# Patient Record
Sex: Female | Born: 1990 | Race: White | Hispanic: No | Marital: Single | Smoking: Never smoker
Health system: Southern US, Community
[De-identification: ages and names within clinical notes are randomized; demographics above are authoritative.]

## PROBLEM LIST (undated history)

## (undated) DIAGNOSIS — E041 Nontoxic single thyroid nodule: Secondary | ICD-10-CM

## (undated) DIAGNOSIS — F988 Other specified behavioral and emotional disorders with onset usually occurring in childhood and adolescence: Secondary | ICD-10-CM

## (undated) DIAGNOSIS — T7840XA Allergy, unspecified, initial encounter: Secondary | ICD-10-CM

## (undated) DIAGNOSIS — K219 Gastro-esophageal reflux disease without esophagitis: Secondary | ICD-10-CM

## (undated) HISTORY — PX: TONSILLECTOMY: SUR1361

## (undated) HISTORY — PX: EYE SURGERY: SHX253

---
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
## 2013-02-28 IMAGING — CR DG CHEST 2V
1 series · 3 of 3 positions shown · non-contrast
Comparison: none

REASON FOR EXAM: c/o cough and chest congestion for a month
COMMENTS:

PROCEDURE:     MDR - MDR CHEST PA(OR AP) AND LATERAL  - August 08, 2011 [DATE]
RESULT:     The lungs are clear. The cardiac silhouette and visualized bony
skeleton are unremarkable.

[Series 1: view not recorded · 0.17mm/px · 3 of 3 slices shown]
[im 1/3]
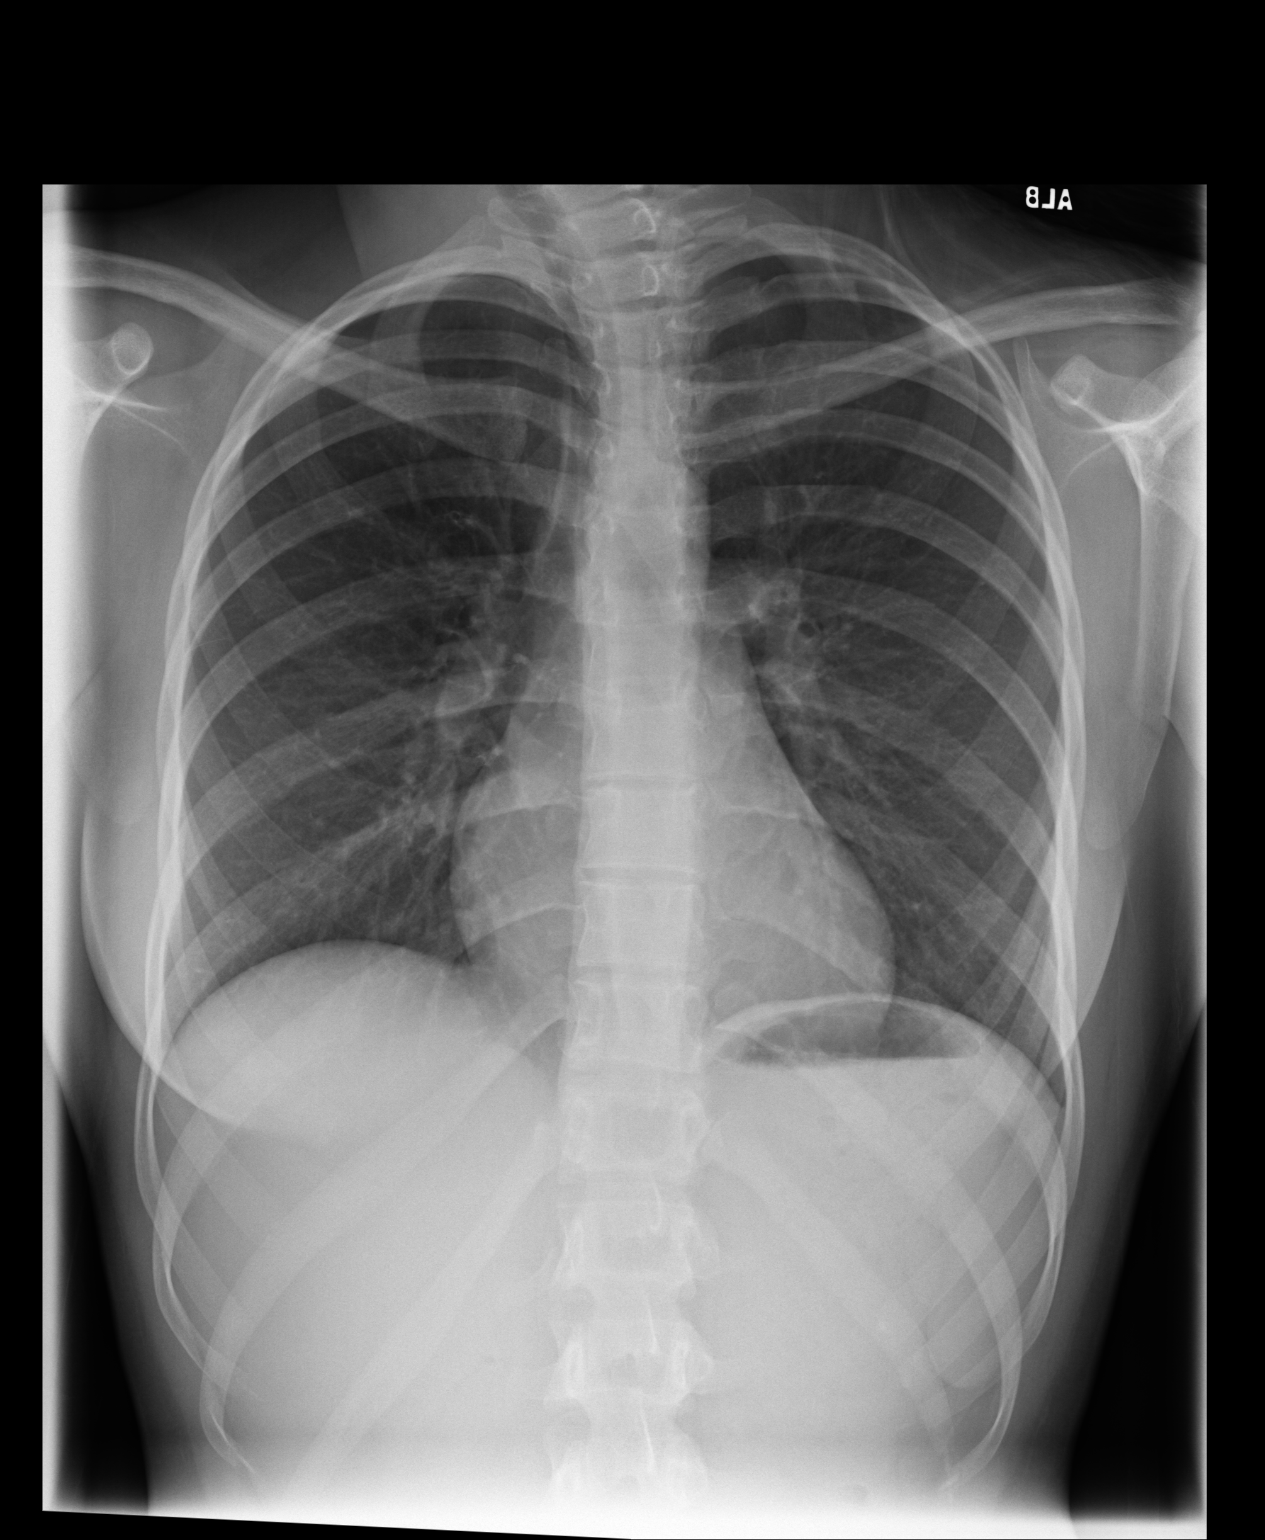
[im 2/3]
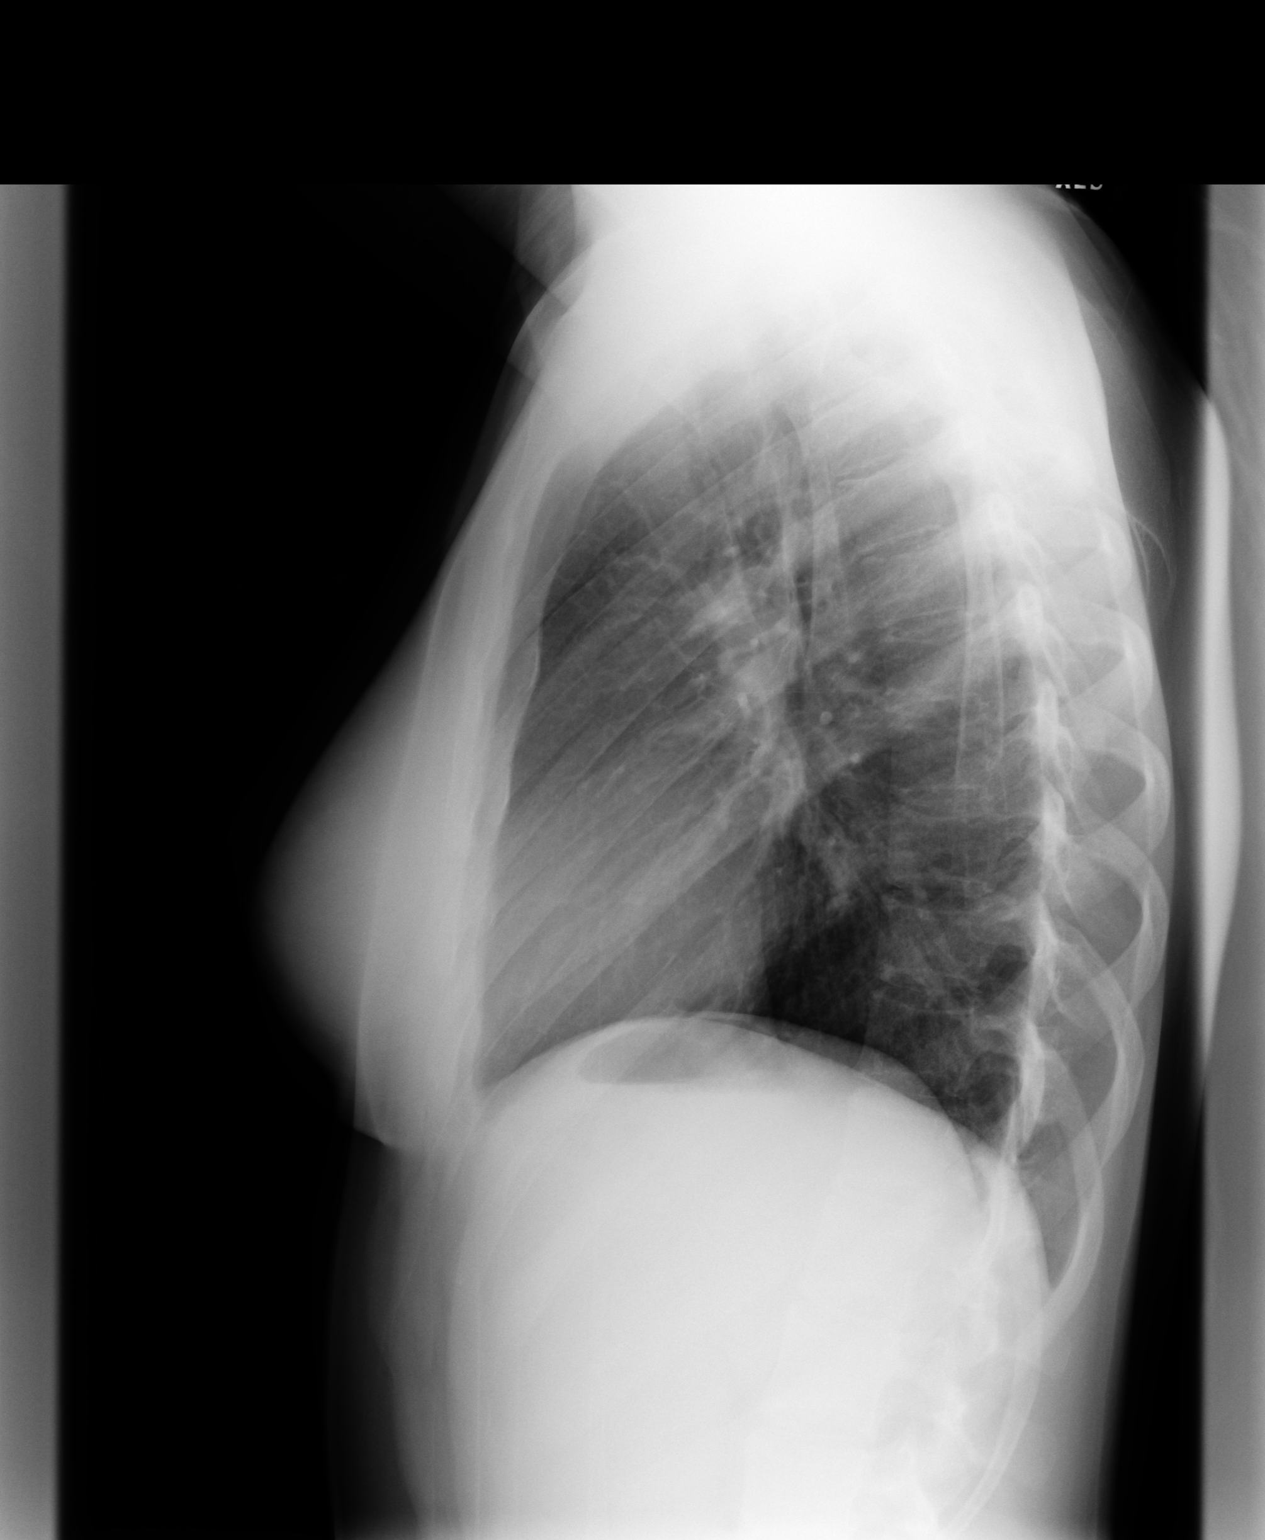
[im 3/3]
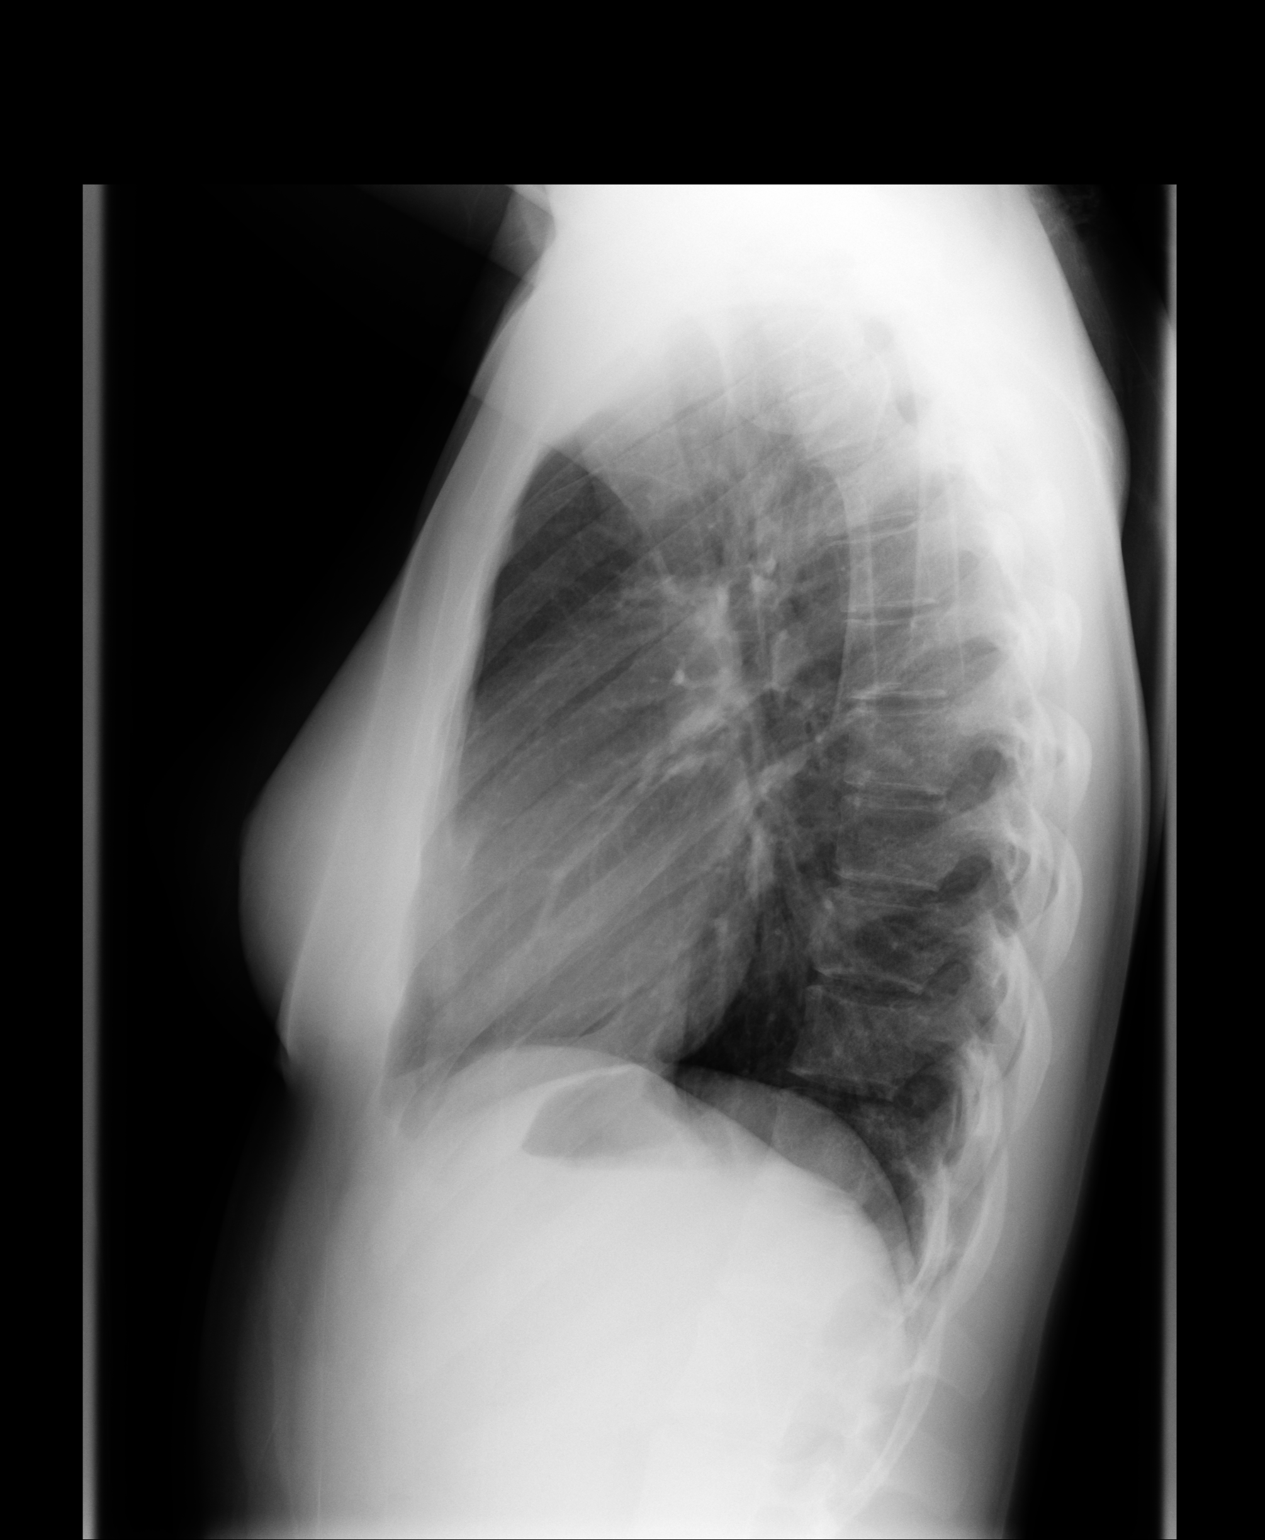

[3 of 3 positions shown; findings below may reference images not displayed]

IMPRESSION: 1. Chest radiograph without evidence of acute cardiopulmonary disease.

## 2017-06-14 ENCOUNTER — Other Ambulatory Visit
Admission: RE | Admit: 2017-06-14 | Discharge: 2017-06-14 | Disposition: A | Discharge status: Home / Self Care | Payer: 59 | Source: Ambulatory Visit | Attending: Gastroenterology | Admitting: Gastroenterology

## 2017-06-14 DIAGNOSIS — R1032 Left lower quadrant pain: Secondary | ICD-10-CM | POA: Diagnosis present

## 2017-06-14 DIAGNOSIS — R1031 Right lower quadrant pain: Secondary | ICD-10-CM | POA: Diagnosis present

## 2017-06-14 DIAGNOSIS — R197 Diarrhea, unspecified: Secondary | ICD-10-CM | POA: Diagnosis present

## 2017-06-14 LAB — GASTROINTESTINAL PANEL BY PCR, STOOL (REPLACES STOOL CULTURE)
Adenovirus F40/41: NOT DETECTED
Astrovirus: NOT DETECTED
CRYPTOSPORIDIUM: NOT DETECTED
Campylobacter species: NOT DETECTED
Cyclospora cayetanensis: NOT DETECTED
ENTAMOEBA HISTOLYTICA: NOT DETECTED
Enteroaggregative E coli (EAEC): NOT DETECTED
Enteropathogenic E coli (EPEC): NOT DETECTED
Enterotoxigenic E coli (ETEC): NOT DETECTED
Giardia lamblia: NOT DETECTED
NOROVIRUS GI/GII: NOT DETECTED
Plesimonas shigelloides: NOT DETECTED
ROTAVIRUS A: NOT DETECTED
SALMONELLA SPECIES: NOT DETECTED
SAPOVIRUS (I, II, IV, AND V): NOT DETECTED
SHIGELLA/ENTEROINVASIVE E COLI (EIEC): NOT DETECTED
Shiga like toxin producing E coli (STEC): NOT DETECTED
Vibrio cholerae: NOT DETECTED
Vibrio species: NOT DETECTED
Yersinia enterocolitica: NOT DETECTED

## 2017-06-14 LAB — C DIFFICILE QUICK SCREEN W PCR REFLEX
C DIFFICILE (CDIFF) INTERP: NOT DETECTED
C DIFFICILE (CDIFF) TOXIN: NEGATIVE
C DIFFICLE (CDIFF) ANTIGEN: NEGATIVE

## 2017-08-25 ENCOUNTER — Encounter: Payer: Self-pay | Admitting: *Deleted

## 2017-08-30 ENCOUNTER — Encounter
Admission: RE | Disposition: A | Discharge status: Home / Self Care | Payer: Self-pay | Source: Ambulatory Visit | Attending: Unknown Physician Specialty

## 2017-08-30 ENCOUNTER — Encounter: Payer: Self-pay | Admitting: *Deleted

## 2017-08-30 ENCOUNTER — Ambulatory Visit: Payer: 59 | Admitting: Anesthesiology

## 2017-08-30 ENCOUNTER — Ambulatory Visit
Admission: RE | Admit: 2017-08-30 | Discharge: 2017-08-30 | Disposition: A | Discharge status: Home / Self Care | Payer: 59 | Source: Ambulatory Visit | Attending: Unknown Physician Specialty | Admitting: Unknown Physician Specialty

## 2017-08-30 DIAGNOSIS — Z79899 Other long term (current) drug therapy: Secondary | ICD-10-CM | POA: Insufficient documentation

## 2017-08-30 DIAGNOSIS — K529 Noninfective gastroenteritis and colitis, unspecified: Secondary | ICD-10-CM | POA: Insufficient documentation

## 2017-08-30 DIAGNOSIS — K640 First degree hemorrhoids: Secondary | ICD-10-CM | POA: Diagnosis not present

## 2017-08-30 DIAGNOSIS — R1013 Epigastric pain: Secondary | ICD-10-CM | POA: Diagnosis not present

## 2017-08-30 DIAGNOSIS — K219 Gastro-esophageal reflux disease without esophagitis: Secondary | ICD-10-CM | POA: Diagnosis not present

## 2017-08-30 DIAGNOSIS — K2970 Gastritis, unspecified, without bleeding: Secondary | ICD-10-CM | POA: Insufficient documentation

## 2017-08-30 DIAGNOSIS — K2980 Duodenitis without bleeding: Secondary | ICD-10-CM | POA: Diagnosis not present

## 2017-08-30 HISTORY — PX: COLONOSCOPY WITH PROPOFOL: SHX5780

## 2017-08-30 HISTORY — DX: Nontoxic single thyroid nodule: E04.1

## 2017-08-30 HISTORY — PX: ESOPHAGOGASTRODUODENOSCOPY (EGD) WITH PROPOFOL: SHX5813

## 2017-08-30 HISTORY — DX: Gastro-esophageal reflux disease without esophagitis: K21.9

## 2017-08-30 HISTORY — DX: Other specified behavioral and emotional disorders with onset usually occurring in childhood and adolescence: F98.8

## 2017-08-30 HISTORY — DX: Allergy, unspecified, initial encounter: T78.40XA

## 2017-08-30 LAB — POCT PREGNANCY, URINE: Preg Test, Ur: NEGATIVE

## 2017-08-30 SURGERY — ESOPHAGOGASTRODUODENOSCOPY (EGD) WITH PROPOFOL
Anesthesia: General

## 2017-08-30 MED ORDER — FENTANYL CITRATE (PF) 100 MCG/2ML IJ SOLN
INTRAMUSCULAR | Status: DC | PRN
  Administered 2017-08-30 (×2): 50 ug via INTRAVENOUS

## 2017-08-30 MED ORDER — MIDAZOLAM HCL 2 MG/2ML IJ SOLN
INTRAMUSCULAR | Status: AC
  Filled 2017-08-30: qty 2

## 2017-08-30 MED ORDER — PROPOFOL 10 MG/ML IV BOLUS
INTRAVENOUS | Status: DC | PRN
  Administered 2017-08-30 (×2): 20 mg via INTRAVENOUS
  Administered 2017-08-30: 30 mg via INTRAVENOUS

## 2017-08-30 MED ORDER — FENTANYL CITRATE (PF) 100 MCG/2ML IJ SOLN
INTRAMUSCULAR | Status: AC
  Filled 2017-08-30: qty 2

## 2017-08-30 MED ORDER — SODIUM CHLORIDE 0.9 % IV SOLN: INTRAVENOUS | Status: DC

## 2017-08-30 MED ORDER — SODIUM CHLORIDE 0.9 % IV SOLN
INTRAVENOUS | Status: DC
  Administered 2017-08-30: 09:00:00 via INTRAVENOUS

## 2017-08-30 MED ORDER — PROPOFOL 500 MG/50ML IV EMUL
INTRAVENOUS | Status: DC | PRN
  Administered 2017-08-30: 75 ug/kg/min via INTRAVENOUS

## 2017-08-30 MED ORDER — GLYCOPYRROLATE 0.2 MG/ML IJ SOLN
INTRAMUSCULAR | Status: DC | PRN
  Administered 2017-08-30: 0.2 mg via INTRAVENOUS

## 2017-08-30 MED ORDER — MIDAZOLAM HCL 5 MG/5ML IJ SOLN
INTRAMUSCULAR | Status: DC | PRN
  Administered 2017-08-30: 2 mg via INTRAVENOUS

## 2017-08-30 MED ORDER — LIDOCAINE HCL (PF) 2 % IJ SOLN
INTRAMUSCULAR | Status: DC | PRN
  Administered 2017-08-30: 60 mg

## 2017-08-30 NOTE — H&P (Signed)
   Primary Care Physician:  Titus MouldWhite, Elizabeth Burney, NP Primary Gastroenterologist:  Dr. Mechele CollinElliott  Pre-Procedure History & Physical: HPI:  Kara Clay is a 26 y.o. female is here for an endoscopy and colonoscopy.   Past Medical History:  Diagnosis Date  . ADD (attention deficit disorder)   . Allergic state   . GERD (gastroesophageal reflux disease)   . Thyroid nodule     Past Surgical History:  Procedure Laterality Date  . EYE SURGERY    . TONSILLECTOMY      Prior to Admission medications   Medication Sig Start Date End Date Taking? Authorizing Provider  clonazePAM (KLONOPIN) 0.5 MG tablet Take 0.5 mg by mouth 2 (two) times daily as needed for anxiety (May take 2 tabs po qhs).   Yes [provider]  drospirenone-ethinyl estradiol (YASMIN,ZARAH,SYEDA) 3-0.03 MG tablet Take 1 tablet by mouth daily.   Yes [provider]  escitalopram (LEXAPRO) 20 MG tablet Take 20 mg by mouth daily.   Yes [provider]  hyoscyamine (LEVSIN) 0.125 MG/5ML ELIX Take 0.125 mg by mouth every 6 (six) hours as needed.   Yes [provider]  omeprazole (PRILOSEC) 20 MG capsule Take 20 mg by mouth daily.   Yes [provider]    Allergies as of 07/06/2017  . (Not on File)    History reviewed. No pertinent family history.  Social History   Socioeconomic History  . Marital status: Single    Spouse name: Not on file  . Number of children: Not on file  . Years of education: Not on file  . Highest education level: Not on file  Social Needs  . Financial resource strain: Not on file  . Food insecurity - worry: Not on file  . Food insecurity - inability: Not on file  . Transportation needs - medical: Not on file  . Transportation needs - non-medical: Not on file  Occupational History  . Not on file  Tobacco Use  . Smoking status: Never Smoker  . Smokeless tobacco: Never Used  Substance and Sexual Activity  . Alcohol use: No    Frequency: Never   . Drug use: No  . Sexual activity: Not on file  Other Topics Concern  . Not on file  Social History Narrative  . Not on file    Review of Systems: See HPI, otherwise negative ROS  Physical Exam: BP 117/75   Pulse 99   Temp 98.7 F (37.1 C) (Tympanic)   Resp 16   Ht 5\' 4"  (1.626 m)   Wt 67.1 kg (148 lb)   LMP 08/16/2017 (Approximate)   SpO2 99%   BMI 25.40 kg/m  General:   Alert,  pleasant and cooperative in NAD Head:  Normocephalic and atraumatic. Neck:  Supple; no masses or thyromegaly. Lungs:  Clear throughout to auscultation.    Heart:  Regular rate and rhythm. Abdomen:  Soft, nontender and nondistended. Normal bowel sounds, without guarding, and without rebound.   Neurologic:  Alert and  oriented x4;  grossly normal neurologically.  Impression/Plan: Kara Clay is here for an endoscopy and colonoscopy to be performed for diarrhea abdominal pain, dyspepsia.  Risks, benefits, limitations, and alternatives regarding  endoscopy and colonoscopy have been reviewed with the patient.  Questions have been answered.  All parties agreeable.   Lynnae PrudeELLIOTT, Jeffrie Lofstrom, MD  08/30/2017, 9:22 AM

## 2017-08-30 NOTE — Anesthesia Preprocedure Evaluation (Signed)
Anesthesia Evaluation  Patient identified by MRN, date of birth, ID band Patient awake    Reviewed: Allergy & Precautions, NPO status , Patient's Chart, lab work & pertinent test results  Airway Mallampati: I       Dental  (+) Teeth Intact   Pulmonary neg pulmonary ROS,    breath sounds clear to auscultation       Cardiovascular Exercise Tolerance: Good  Rhythm:Regular Rate:Normal     Neuro/Psych negative neurological ROS     GI/Hepatic Neg liver ROS, GERD  Medicated,  Endo/Other  negative endocrine ROS  Renal/GU negative Renal ROS  negative genitourinary   Musculoskeletal negative musculoskeletal ROS (+)   Abdominal Normal abdominal exam  (+)   Peds negative pediatric ROS (+)  Hematology negative hematology ROS (+)   Anesthesia Other Findings   Reproductive/Obstetrics                             Anesthesia Physical Anesthesia Plan  ASA: I  Anesthesia Plan: General   Post-op Pain Management:    Induction: Intravenous  PONV Risk Score and Plan: Ondansetron  Airway Management Planned: Natural Airway and Nasal Cannula  Additional Equipment:   Intra-op Plan:   Post-operative Plan:   Informed Consent: I have reviewed the patients History and Physical, chart, labs and discussed the procedure including the risks, benefits and alternatives for the proposed anesthesia with the patient or authorized representative who has indicated his/her understanding and acceptance.     Plan Discussed with: CRNA  Anesthesia Plan Comments:         Anesthesia Quick Evaluation

## 2017-08-30 NOTE — Transfer of Care (Signed)
Immediate Anesthesia Transfer of Care Note  Patient: Kara Clay  Procedure(s) Performed: ESOPHAGOGASTRODUODENOSCOPY (EGD) WITH PROPOFOL (N/A ) COLONOSCOPY WITH PROPOFOL (N/A )  Patient Location: PACU  Anesthesia Type:General  Level of Consciousness: sedated  Airway & Oxygen Therapy: Patient Spontanous Breathing and Patient connected to nasal cannula oxygen  Post-op Assessment: Report given to RN and Post -op Vital signs reviewed and stable  Post vital signs: Reviewed and stable  Last Vitals:  Vitals:   08/30/17 0842 08/30/17 1002  BP: 117/75 110/63  Pulse: 99 91  Resp: 16 13  Temp: 37.1 C (!) 36.1 C  SpO2: 99% 97%    Last Pain:  Vitals:   08/30/17 1002  TempSrc: Tympanic         Complications: No apparent anesthesia complications

## 2017-08-30 NOTE — Anesthesia Postprocedure Evaluation (Signed)
Anesthesia Post Note  Patient: [name]  Procedure(s) Performed: ESOPHAGOGASTRODUODENOSCOPY (EGD) WITH PROPOFOL (N/A ) COLONOSCOPY WITH PROPOFOL (N/A )  Patient location during evaluation: PACU Anesthesia Type: General Level of consciousness: awake Pain management: pain level controlled Vital Signs Assessment: post-procedure vital signs reviewed and stable Respiratory status: spontaneous breathing Cardiovascular status: stable Anesthetic complications: no     Last Vitals:  Vitals:   08/30/17 1022 08/30/17 1032  BP: 109/75 119/75  Pulse: 97 (!) 101  Resp: 13 13  Temp:    SpO2: 98% 96%    Last Pain:  Vitals:   08/30/17 1002  TempSrc: Tympanic                 Kara Clay

## 2017-08-30 NOTE — Op Note (Addendum)
[facility] Gastroenterology Patient Name: Kara CocoBrittney Vespa Procedure Date: 08/30/2017 9:26 AM MRN: [id number] Account #: [id number] Date of Birth: 1991/09/16 Admit Type: Outpatient Age: [age] [facility] Gender: Female Note Status: Finalized Procedure:            Upper GI endoscopy Indications:          Epigastric abdominal pain, Diarrhea Providers:            Scot Junobert T. Parilee Hally, MD Referring MD:         Courtney ParisElizabeth B. White, MD (Referring MD) Medicines:            Propofol per Anesthesia Complications:        No immediate complications. Procedure:            Pre-Anesthesia Assessment:                       - After reviewing the risks and benefits, the patient                        was deemed in satisfactory condition to undergo the                        procedure.                       After obtaining informed consent, the endoscope was                        passed under direct vision. Throughout the procedure,                        the patient's blood pressure, pulse, and oxygen                        saturations were monitored continuously. The                        Colonoscope was introduced through the mouth, and                        advanced to the second part of duodenum. The upper GI                        endoscopy was accomplished without difficulty. The                        patient tolerated the procedure well. Findings:      The examined esophagus was normal. GEJ 39cm.      Diffuse mildly erythematous mucosa without bleeding was found in the       gastric body and in the gastric antrum. Biopsies were taken with a cold       forceps for histology. Biopsies were taken with a cold forceps for       Helicobacter pylori testing.      Normal mucosa was found in the second portion of the duodenum. Biopsies       were taken with a cold forceps for histology. This to rule out celiac       disease as a cause of diarrhea      The duodenal bulb  was normal. Biopsies were taken with a cold  forceps       for histology. Impression:           - Normal esophagus.                       - Erythematous mucosa in the gastric body and antrum.                        Biopsied.                       - Normal mucosa was found in the second portion of the                        duodenum. Biopsied.                       - Normal duodenal bulb. Biopsied. Recommendation:       - Await pathology results.                       - Perform a colonoscopy as previously scheduled. Scot Junobert T Kyree Fedorko, MD 08/30/2017 9:43:13 AM This report has been signed electronically. Number of Addenda: 0 Note Initiated On: 08/30/2017 9:26 AM      [facility]

## 2017-08-30 NOTE — Op Note (Addendum)
[facility] Gastroenterology Patient Name: Kara CocoBrittney Heiler Procedure Date: 08/30/2017 9:26 AM MRN: [id number] Account #: [id number] Date of Birth: 06/17/1991 Admit Type: Outpatient Age: [age] [facility] Gender: Female Note Status: Finalized Procedure:            Colonoscopy Indications:          Chronic diarrhea Providers:            Scot Junobert T. Elliott, MD Referring MD:         Courtney ParisElizabeth B. White, MD (Referring MD) Medicines:            Propofol per Anesthesia Complications:        No immediate complications. Procedure:            Pre-Anesthesia Assessment:                       - After reviewing the risks and benefits, the patient                        was deemed in satisfactory condition to undergo the                        procedure.                       After obtaining informed consent, the colonoscope was                        passed under direct vision. Throughout the procedure,                        the patient's blood pressure, pulse, and oxygen                        saturations were monitored continuously. The                        Colonoscope was introduced through the anus and                        advanced to the the cecum, identified by appendiceal                        orifice and ileocecal valve. The colonoscopy was                        performed without difficulty. The patient tolerated the                        procedure well. The quality of the bowel preparation                        was excellent. Findings:      Internal hemorrhoids were found during endoscopy. The hemorrhoids were       small and Grade I (internal hemorrhoids that do not prolapse). Due to       chronic diarrhea Biopsies done of ascending, transverse, descending and       sigmoid colon. Mucosa appeared normal throughout the colon.      The exam was otherwise without abnormality. Impression:           -  Internal hemorrhoids.                       - The  examination was otherwise normal.                       - No specimens collected. Recommendation:       - Await pathology results. Scot Junobert T Elliott, MD 08/30/2017 10:00:52 AM This report has been signed electronically. Number of Addenda: 0 Note Initiated On: 08/30/2017 9:26 AM Scope Withdrawal Time: 0 hours 8 minutes 6 seconds  Total Procedure Duration: 0 hours 12 minutes 37 seconds       [facility]

## 2017-08-30 NOTE — Anesthesia Post-op Follow-up Note (Signed)
Anesthesia QCDR form completed.        

## 2017-08-31 ENCOUNTER — Encounter: Payer: Self-pay | Admitting: Unknown Physician Specialty

## 2017-08-31 LAB — SURGICAL PATHOLOGY
